# Patient Record
Sex: Male | Born: 1955 | Race: White | Hispanic: No | Marital: Married | Smoking: Former smoker
Health system: Southern US, Community
[De-identification: ages and names within clinical notes are randomized; demographics above are authoritative.]

## PROBLEM LIST (undated history)

## (undated) DIAGNOSIS — Z87442 Personal history of urinary calculi: Secondary | ICD-10-CM

## (undated) DIAGNOSIS — E7800 Pure hypercholesterolemia, unspecified: Secondary | ICD-10-CM

## (undated) DIAGNOSIS — I219 Acute myocardial infarction, unspecified: Secondary | ICD-10-CM

## (undated) DIAGNOSIS — I10 Essential (primary) hypertension: Secondary | ICD-10-CM

## (undated) DIAGNOSIS — I2510 Atherosclerotic heart disease of native coronary artery without angina pectoris: Secondary | ICD-10-CM

## (undated) HISTORY — DX: Essential (primary) hypertension: I10

## (undated) HISTORY — DX: Acute myocardial infarction, unspecified: I21.9

## (undated) HISTORY — DX: Pure hypercholesterolemia, unspecified: E78.00

## (undated) HISTORY — PX: APPENDECTOMY: SHX54

## (undated) HISTORY — PX: INGUINAL HERNIA REPAIR: SUR1180

## (undated) HISTORY — PX: OTHER SURGICAL HISTORY: SHX169

## (undated) HISTORY — DX: Atherosclerotic heart disease of native coronary artery without angina pectoris: I25.10

## (undated) HISTORY — PX: ANTERIOR CRUCIATE LIGAMENT REPAIR: SHX115

---

## 1999-07-24 HISTORY — PX: ANTERIOR CRUCIATE LIGAMENT REPAIR: SHX115

## 2005-07-23 HISTORY — PX: OTHER SURGICAL HISTORY: SHX169

## 2006-05-23 DIAGNOSIS — I219 Acute myocardial infarction, unspecified: Secondary | ICD-10-CM

## 2006-05-23 HISTORY — DX: Acute myocardial infarction, unspecified: I21.9

## 2012-02-01 ENCOUNTER — Encounter: Payer: Self-pay | Admitting: Cardiovascular Disease

## 2012-03-31 ENCOUNTER — Institutional Professional Consult (permissible substitution): Payer: Self-pay | Admitting: Cardiovascular Disease

## 2012-04-03 ENCOUNTER — Encounter: Payer: Self-pay | Admitting: *Deleted

## 2012-04-08 ENCOUNTER — Institutional Professional Consult (permissible substitution): Payer: Self-pay | Admitting: Cardiovascular Disease

## 2012-04-11 ENCOUNTER — Encounter: Payer: Self-pay | Admitting: *Deleted

## 2012-04-14 ENCOUNTER — Encounter: Payer: Self-pay | Admitting: Cardiovascular Disease

## 2012-04-14 ENCOUNTER — Ambulatory Visit (INDEPENDENT_AMBULATORY_CARE_PROVIDER_SITE_OTHER): Payer: PRIVATE HEALTH INSURANCE | Admitting: Cardiovascular Disease

## 2012-04-14 VITALS — BP 119/77 | HR 64 | Ht 71.5 in | Wt 248.8 lb

## 2012-04-14 DIAGNOSIS — I2510 Atherosclerotic heart disease of native coronary artery without angina pectoris: Secondary | ICD-10-CM

## 2012-04-14 NOTE — Progress Notes (Signed)
HPI:  56 year-old male presenting for cardiac evaluation. He was initially diagnosed with CAD in 2007 when he was presented with an anterior MI. At that time his symptoms consisted of an ache in the center of his chest. The patient had been a smoker, but he quit smoking completely at the time of his heart attack. He reports rare episodes of a "twinge" in his chest. The patient is physically active without exertional chest tightness or dyspnea. He walks for exercise. He denies palpitations, lightheadedness, orthopnea, or PND. He's been compliant with his medical program ever since his myocardial infarction several years ago. He reports mild myalgias related to atorvastatin use. He otherwise feels well.  Outpatient Encounter Prescriptions as of 04/14/2012  Medication Sig Dispense Refill  . Ascorbic Acid (VITAMIN C) 100 MG tablet Take 100 mg by mouth daily.      Marland Kitchen aspirin 81 MG tablet Take 81 mg by mouth daily.      Marland Kitchen atorvastatin (LIPITOR) 20 MG tablet Take 20 mg by mouth daily. 1/2 tablet daily      . B Complex-C (B-COMPLEX WITH VITAMIN C) tablet Take 1 tablet by mouth daily.      . carvedilol (COREG) 6.25 MG tablet Take 6.25 mg by mouth 2 (two) times daily with a meal.      . fish oil-omega-3 fatty acids 1000 MG capsule Take 1 g by mouth daily.      Marland Kitchen lisinopril (PRINIVIL,ZESTRIL) 5 MG tablet Take 5 mg by mouth daily.      Marland Kitchen OVER THE COUNTER MEDICATION Take 2 tablets by mouth daily. OsteoMatrix- Shaklee      . OVER THE COUNTER MEDICATION Take 1 tablet by mouth daily. Vita-Lea-Shaklee      . Zinc 15 MG CAPS Take 15 mg by mouth daily.      . calcium carbonate (OS-CAL) 600 MG TABS Take 600 mg by mouth daily.      Marland Kitchen DISCONTD: magnesium gluconate (MAGONATE) 500 MG tablet Take 500 mg by mouth daily.      Marland Kitchen DISCONTD: Multiple Vitamin (MULTIVITAMIN) capsule Take 1 capsule by mouth daily.        Lobster  Past Medical History  Diagnosis Date  . Heart attack 05/2006    s/p cordis stent LAD   .  Hypercholesterolemia   . HTN (hypertension)   . Coronary atherosclerosis     of native coronary artery    Past Surgical History  Procedure Date  . Anterior cruciate ligament repair     2001    History   Social History  . Marital Status: Married    Spouse Name: N/A    Number of Children: N/A  . Years of Education: N/A   Occupational History  . Not on file.   Social History Main Topics  . Smoking status: Former Smoker -- 1.5 packs/day for 30 years    Quit date: 02/08/2006  . Smokeless tobacco: Not on file  . Alcohol Use: No  . Drug Use: Not on file  . Sexually Active: Not on file   Other Topics Concern  . Not on file   Social History Narrative  . No narrative on file    Family History  Problem Relation Age of Onset  . COPD Father   . Heart failure Mother   . Heart failure Brother     ROS: General: no fevers/chills/night sweats Eyes: no blurry vision, diplopia, or amaurosis ENT: no sore throat or hearing loss Resp: no cough, wheezing,  or hemoptysis CV: no edema or palpitations GI: no abdominal pain, nausea, vomiting, diarrhea, or constipation GU: no dysuria, frequency, or hematuria. Positive for nocturia. Skin: no rash Neuro: no headache, numbness, tingling, or weakness of extremities Musculoskeletal: no joint pain or swelling Heme: no bleeding, DVT, or easy bruising Endo: no polydipsia or polyuria  BP 119/77  Pulse 64  Ht 5' 11.5" (1.816 m)  Wt 112.855 kg (248 lb 12.8 oz)  BMI 34.22 kg/m2  PHYSICAL EXAM: Pt is alert and oriented, WD, WN, pleasant overweight male in no distress. HEENT: normal Neck: JVP normal. Carotid upstrokes normal without bruits. No thyromegaly. Lungs: equal expansion, clear bilaterally CV: Apex is discrete and nondisplaced, RRR without murmur or gallop Abd: soft, NT, +BS, no bruit, no hepatosplenomegaly Back: no CVA tenderness Ext: no C/C/E        Femoral pulses 2+= without bruits        DP/PT pulses intact and = Skin: warm  and dry without rash Neuro: CNII-XII intact             Strength intact = bilaterally  EKG:  Sinus rhythm 59 bpm, within normal limits  ASSESSMENT AND PLAN: 1. Coronary artery disease, native vessel. The patient appears stable from a cardiovascular perspective. He is on appropriate medical program that includes aspirin, atorvastatin, carvedilol, and lisinopril. I have reviewed outside records from his previous cardiologist and it appears the patient's left ventricular function has been preserved with an ejection fraction of 55-60%. Followup stress testing showed no significant areas of ischemia. I would like to see him back in one year for followup.  2. Hyperlipidemia. Lipids followed by Dr. Alverda Skeans. Patient is on a statin drug. Statin dose is limited by myalgias. Goal LDL cholesterol is less than 70 mg/dL.  3. Hypertension. Excellent control blood pressure on a combination of carvedilol and lisinopril.  For followup, I would like to see the patient back in 12 months. I appreciate the opportunity to see this nice gentleman.  Tonny Bollman 05/13/2012 6:01 PM

## 2012-04-14 NOTE — Patient Instructions (Addendum)
Your physician wants you to follow-up in: 1 YEAR.  You will receive a reminder letter in the mail two months in advance. If you don't receive a letter, please call our office to schedule the follow-up appointment.  Your physician recommends that you continue on your current medications as directed. Please refer to the Current Medication list given to you today.  

## 2012-06-25 ENCOUNTER — Encounter: Payer: Self-pay | Admitting: Cardiovascular Disease

## 2012-07-14 ENCOUNTER — Encounter: Payer: Self-pay | Admitting: Cardiovascular Disease

## 2012-09-06 ENCOUNTER — Other Ambulatory Visit: Payer: Self-pay

## 2013-05-28 ENCOUNTER — Other Ambulatory Visit: Payer: Self-pay

## 2016-03-08 ENCOUNTER — Other Ambulatory Visit (HOSPITAL_COMMUNITY): Payer: Self-pay | Admitting: Nurse Practitioner

## 2016-03-08 DIAGNOSIS — B182 Chronic viral hepatitis C: Secondary | ICD-10-CM

## 2016-03-23 ENCOUNTER — Encounter: Payer: Self-pay | Admitting: Cardiovascular Disease

## 2016-04-09 ENCOUNTER — Encounter: Payer: Self-pay | Admitting: Cardiovascular Disease

## 2016-04-09 ENCOUNTER — Ambulatory Visit (INDEPENDENT_AMBULATORY_CARE_PROVIDER_SITE_OTHER): Payer: PRIVATE HEALTH INSURANCE | Admitting: Cardiovascular Disease

## 2016-04-09 VITALS — BP 112/60 | HR 62 | Ht 71.5 in | Wt 247.0 lb

## 2016-04-09 DIAGNOSIS — I25118 Atherosclerotic heart disease of native coronary artery with other forms of angina pectoris: Secondary | ICD-10-CM | POA: Diagnosis not present

## 2016-04-09 NOTE — Patient Instructions (Signed)
Medication Instructions:  Your physician recommends that you continue on your current medications as directed. Please refer to the Current Medication list given to you today.  Labwork: No new orders.   Testing/Procedures: No new orders.   Follow-Up: Your physician wants you to follow-up in: 2 YEARS with Dr Cooper.  You will receive a reminder letter in the mail two months in advance. If you don't receive a letter, please call our office to schedule the follow-up appointment.   Any Other Special Instructions Will Be Listed Below (If Applicable).     If you need a refill on your cardiac medications before your next appointment, please call your pharmacy.   

## 2016-04-09 NOTE — Progress Notes (Signed)
Cardiology Office Note Date:  04/09/2016   ID:  Fernando Mckay, DOB 06/29/1956, MRN [id number]  PCP:  Horatio Pel, MD  Cardiologist:  Sherren Mocha, MD    Chief Complaint  Patient presents with  . Coronary Artery Disease    Old MI, last seen 2013   History of Present Illness: Fernando Mckay is a 60 y.o. male who presents for evaluation of CAD.   The patient was last seen in 2013. He was initially diagnosed with coronary artery disease in 2007 when he presented with an anterior wall MI. He described symptoms of a dull ache in the center of his chest. He is a former smoker and quit at the time of his MI. His left ventricular function has been preserved with an ejection fraction of 55-60% at time of last assessment. When he was seen last he had no cardiac related complaints. Other medical problems include hypertension and hypercholesterolemia.  The patient is doing well. He has occasional discomfort in the right chest that is unlike his previous angina. It is never related to physical exertion. He thinks it is "gas pain." He denies chest pain or pressure, shortness of breath, edema, orthopnea, PND, or heart palpitations. He exercises at the gym about 3 days per week. He's had trouble losing weight. He plans to move to [place] in a few years after he retires.   Past Medical History:  Diagnosis Date  . Coronary atherosclerosis    of native coronary artery  . Heart attack ([place]) 05/2006   s/p cordis stent LAD   . HTN (hypertension)   . Hypercholesterolemia     Past Surgical History:  Procedure Laterality Date  . ANTERIOR CRUCIATE LIGAMENT REPAIR     2001    Current Outpatient Prescriptions  Medication Sig Dispense Refill  . Ascorbic Acid (VITAMIN C) 100 MG tablet Take 100 mg by mouth daily.    Marland Kitchen aspirin 81 MG tablet Take 81 mg by mouth daily.    Marland Kitchen atorvastatin (LIPITOR) 20 MG tablet Take 10 mg by mouth daily. 1/2 tablet daily    . B Complex-C (B-COMPLEX WITH  VITAMIN C) tablet Take 1 tablet by mouth daily.    . calcium carbonate (OS-CAL) 600 MG TABS Take 600 mg by mouth daily.    . carvedilol (COREG) 6.25 MG tablet Take 6.25 mg by mouth 2 (two) times daily with a meal.    . fish oil-omega-3 fatty acids 1000 MG capsule Take 1 g by mouth daily.    Marland Kitchen lisinopril (PRINIVIL,ZESTRIL) 5 MG tablet Take 5 mg by mouth daily.    . NON FORMULARY Take 1 Dose by mouth daily. CQR    . NON FORMULARY Take 1,200 mg by mouth daily. Sveratrol    . OVER THE COUNTER MEDICATION Take 2 tablets by mouth daily. OsteoMatrix- Shaklee    . OVER THE COUNTER MEDICATION Take 1 tablet by mouth daily. Vita-Lea-Shaklee    . Zinc 15 MG CAPS Take 15 mg by mouth daily.     No current facility-administered medications for this visit.     Allergies:   Lobster [shellfish allergy]; Sertraline; Shellfish-derived products; and Terbinafine hcl   Social History:  The patient  reports that he quit smoking about 10 years ago. He has a 45.00 pack-year smoking history. He has never used smokeless tobacco. He reports that he does not drink alcohol or use drugs.   Family History:  The patient's family history includes COPD in his father; Heart failure in  his brother and mother.    ROS:  Please see the history of present illness.  Otherwise, review of systems is positive for leg pains.  All other systems are reviewed and negative.    PHYSICAL EXAM: VS:  BP 112/60   Pulse 62   Ht 5' 11.5" (1.816 m)   Wt 247 lb (112 kg)   BMI 33.97 kg/m  , BMI Body mass index is 33.97 kg/m. GEN: Well nourished, well developed, in no acute distress  HEENT: normal  Neck: no JVD, no masses. No carotid bruits Cardiac: RRR without murmur or gallop                Respiratory:  clear to auscultation bilaterally, normal work of breathing GI: soft, nontender, nondistended, + BS, obese MS: no deformity or atrophy  Ext: no pretibial edema, pedal pulses 2+= bilaterally Skin: warm and dry, no rash Neuro:  Strength  and sensation are intact Psych: euthymic mood, full affect  EKG:  EKG is ordered today. The ekg ordered today shows NSR, within normal limits, 62 bpm  Recent Labs: No results found for requested labs within last 8760 hours.   Lipid Panel  No results found for: CHOL, TRIG, HDL, CHOLHDL, VLDL, LDLCALC, LDLDIRECT    Wt Readings from Last 3 Encounters:  04/09/16 247 lb (112 kg)  04/14/12 248 lb 12.8 oz (112.9 kg)     ASSESSMENT AND PLAN: 1.  Coronary artery disease, native vessel, without angina. Right-sided chest pain at rest is not consistent with his previous cardiac pain and I do not think it represents angina. He will continue with his current medical program. CV risk factors appear well controlled. Needs to work on diet and weight loss. We discussed this at length today.  2. Hyperlipidemia: Recent lipids reviewed with a cholesterol of 110, triglycerides 100, HDL 31, LDL 62 mg/dL. He is concerned that his leg pain is related to use of a statin drug. After discussion of the risk/benefit he prefers to continue atorvastatin at the current dose. He will work on diet and lifestyle modification.  3. Essential hypertension: Blood pressure is well controlled on carvedilol and lisinopril  Current medicines are reviewed with the patient today.  The patient does not have concerns regarding medicines.  Labs/ tests ordered today include:   Orders Placed This Encounter  Procedures  . EKG 12-Lead   Disposition:   FU 2 years. Consider a stress test at that time depending on his symptoms.   Deatra James, MD  04/09/2016 10:19 PM    [facility] [address], [place],   [address] Phone: [phone]; Fax: [phone]

## 2016-04-24 ENCOUNTER — Ambulatory Visit (HOSPITAL_COMMUNITY)
Admission: RE | Admit: 2016-04-24 | Discharge: 2016-04-24 | Disposition: A | Discharge status: Home / Self Care | Payer: PRIVATE HEALTH INSURANCE | Source: Ambulatory Visit | Attending: Nurse Practitioner | Admitting: Nurse Practitioner

## 2016-04-24 DIAGNOSIS — N200 Calculus of kidney: Secondary | ICD-10-CM | POA: Insufficient documentation

## 2016-04-24 DIAGNOSIS — R932 Abnormal findings on diagnostic imaging of liver and biliary tract: Secondary | ICD-10-CM | POA: Diagnosis not present

## 2016-04-24 DIAGNOSIS — B182 Chronic viral hepatitis C: Secondary | ICD-10-CM | POA: Insufficient documentation

## 2016-04-24 DIAGNOSIS — K7689 Other specified diseases of liver: Secondary | ICD-10-CM | POA: Insufficient documentation

## 2016-04-24 DIAGNOSIS — K740 Hepatic fibrosis: Secondary | ICD-10-CM | POA: Diagnosis not present

## 2016-12-05 ENCOUNTER — Other Ambulatory Visit: Payer: Self-pay | Admitting: Nurse Practitioner

## 2016-12-05 DIAGNOSIS — K7469 Other cirrhosis of liver: Secondary | ICD-10-CM

## 2016-12-21 ENCOUNTER — Other Ambulatory Visit: Payer: PRIVATE HEALTH INSURANCE

## 2016-12-25 ENCOUNTER — Ambulatory Visit
Admission: RE | Admit: 2016-12-25 | Discharge: 2016-12-25 | Disposition: A | Discharge status: Home / Self Care | Payer: PRIVATE HEALTH INSURANCE | Source: Ambulatory Visit | Attending: Nurse Practitioner | Admitting: Nurse Practitioner

## 2016-12-25 DIAGNOSIS — K7469 Other cirrhosis of liver: Secondary | ICD-10-CM

## 2017-06-07 ENCOUNTER — Other Ambulatory Visit: Payer: Self-pay | Admitting: Nurse Practitioner

## 2017-06-07 DIAGNOSIS — K7469 Other cirrhosis of liver: Secondary | ICD-10-CM

## 2017-07-04 ENCOUNTER — Encounter: Payer: Self-pay | Admitting: Cardiovascular Disease

## 2017-08-06 ENCOUNTER — Ambulatory Visit
Admission: RE | Admit: 2017-08-06 | Discharge: 2017-08-06 | Disposition: A | Discharge status: Home / Self Care | Payer: PRIVATE HEALTH INSURANCE | Source: Ambulatory Visit | Attending: Nurse Practitioner | Admitting: Nurse Practitioner

## 2017-08-06 DIAGNOSIS — K7469 Other cirrhosis of liver: Secondary | ICD-10-CM

## 2017-11-19 ENCOUNTER — Other Ambulatory Visit: Payer: Self-pay | Admitting: Nurse Practitioner

## 2017-11-19 DIAGNOSIS — K7469 Other cirrhosis of liver: Secondary | ICD-10-CM

## 2017-12-31 ENCOUNTER — Other Ambulatory Visit: Payer: BLUE CROSS/BLUE SHIELD

## 2018-01-01 ENCOUNTER — Ambulatory Visit
Admission: RE | Admit: 2018-01-01 | Discharge: 2018-01-01 | Disposition: A | Discharge status: Home / Self Care | Payer: BLUE CROSS/BLUE SHIELD | Source: Ambulatory Visit | Attending: Nurse Practitioner | Admitting: Nurse Practitioner

## 2018-01-01 DIAGNOSIS — K7469 Other cirrhosis of liver: Secondary | ICD-10-CM

## 2018-04-22 ENCOUNTER — Other Ambulatory Visit: Payer: Self-pay | Admitting: Urology

## 2018-04-22 HISTORY — PX: OTHER SURGICAL HISTORY: SHX169

## 2018-05-26 ENCOUNTER — Encounter (HOSPITAL_BASED_OUTPATIENT_CLINIC_OR_DEPARTMENT_OTHER): Payer: Self-pay | Admitting: *Deleted

## 2018-05-26 ENCOUNTER — Other Ambulatory Visit: Payer: Self-pay

## 2018-05-26 NOTE — Progress Notes (Signed)
Spoke with Lyndall Npo after midnight arrive 900 am 06-02-18 [facility] med[facility] to take sip of water: carvedilol lov dr cooper cardiology 04-09-16 epic/chart Driver wife charlotte cell [phone] Surgery orders in epic needs I stat 4 and ekg

## 2018-06-02 ENCOUNTER — Other Ambulatory Visit: Payer: Self-pay

## 2018-06-02 ENCOUNTER — Encounter (HOSPITAL_BASED_OUTPATIENT_CLINIC_OR_DEPARTMENT_OTHER): Payer: Self-pay

## 2018-06-02 ENCOUNTER — Ambulatory Visit (HOSPITAL_BASED_OUTPATIENT_CLINIC_OR_DEPARTMENT_OTHER)
Admission: RE | Admit: 2018-06-02 | Discharge: 2018-06-02 | Disposition: A | Discharge status: Home / Self Care | Payer: BLUE CROSS/BLUE SHIELD | Source: Ambulatory Visit | Attending: Urology | Admitting: Urology

## 2018-06-02 ENCOUNTER — Encounter (HOSPITAL_BASED_OUTPATIENT_CLINIC_OR_DEPARTMENT_OTHER)
Admission: RE | Disposition: A | Discharge status: Home / Self Care | Payer: Self-pay | Source: Ambulatory Visit | Attending: Urology

## 2018-06-02 ENCOUNTER — Ambulatory Visit (HOSPITAL_BASED_OUTPATIENT_CLINIC_OR_DEPARTMENT_OTHER): Payer: BLUE CROSS/BLUE SHIELD | Admitting: Anesthesiology

## 2018-06-02 DIAGNOSIS — Z6833 Body mass index (BMI) 33.0-33.9, adult: Secondary | ICD-10-CM | POA: Diagnosis not present

## 2018-06-02 DIAGNOSIS — C672 Malignant neoplasm of lateral wall of bladder: Secondary | ICD-10-CM | POA: Insufficient documentation

## 2018-06-02 DIAGNOSIS — I10 Essential (primary) hypertension: Secondary | ICD-10-CM | POA: Diagnosis not present

## 2018-06-02 DIAGNOSIS — Z87891 Personal history of nicotine dependence: Secondary | ICD-10-CM | POA: Diagnosis not present

## 2018-06-02 DIAGNOSIS — Z955 Presence of coronary angioplasty implant and graft: Secondary | ICD-10-CM | POA: Diagnosis not present

## 2018-06-02 DIAGNOSIS — I252 Old myocardial infarction: Secondary | ICD-10-CM | POA: Insufficient documentation

## 2018-06-02 DIAGNOSIS — I2510 Atherosclerotic heart disease of native coronary artery without angina pectoris: Secondary | ICD-10-CM | POA: Diagnosis not present

## 2018-06-02 DIAGNOSIS — N201 Calculus of ureter: Secondary | ICD-10-CM | POA: Insufficient documentation

## 2018-06-02 DIAGNOSIS — R109 Unspecified abdominal pain: Secondary | ICD-10-CM | POA: Diagnosis present

## 2018-06-02 DIAGNOSIS — E669 Obesity, unspecified: Secondary | ICD-10-CM | POA: Diagnosis not present

## 2018-06-02 HISTORY — PX: CYSTOSCOPY WITH BIOPSY: SHX5122

## 2018-06-02 HISTORY — PX: HOLMIUM LASER APPLICATION: SHX5852

## 2018-06-02 HISTORY — DX: Personal history of urinary calculi: Z87.442

## 2018-06-02 HISTORY — PX: CYSTOSCOPY WITH RETROGRADE PYELOGRAM, URETEROSCOPY AND STENT PLACEMENT: SHX5789

## 2018-06-02 LAB — POCT I-STAT 4, (NA,K, GLUC, HGB,HCT)
Glucose, Bld: 102 mg/dL — ABNORMAL HIGH (ref 70–99)
HEMATOCRIT: 40 % (ref 39.0–52.0)
Hemoglobin: 13.6 g/dL (ref 13.0–17.0)
Potassium: 4.2 mmol/L (ref 3.5–5.1)
SODIUM: 142 mmol/L (ref 135–145)

## 2018-06-02 SURGERY — CYSTOURETEROSCOPY, WITH RETROGRADE PYELOGRAM AND STENT INSERTION
Anesthesia: General | Site: Ureter

## 2018-06-02 MED ORDER — EPHEDRINE 5 MG/ML INJ
INTRAVENOUS | Status: AC
  Filled 2018-06-02: qty 10

## 2018-06-02 MED ORDER — GLYCOPYRROLATE 0.2 MG/ML IJ SOLN
INTRAMUSCULAR | Status: DC | PRN
  Administered 2018-06-02: 0.2 mg via INTRAVENOUS

## 2018-06-02 MED ORDER — OXYCODONE-ACETAMINOPHEN 5-325 MG PO TABS: 1.0000 | ORAL_TABLET | ORAL | 0 refills | Status: AC | PRN

## 2018-06-02 MED ORDER — PHENYLEPHRINE 40 MCG/ML (10ML) SYRINGE FOR IV PUSH (FOR BLOOD PRESSURE SUPPORT)
PREFILLED_SYRINGE | INTRAVENOUS | Status: AC
  Filled 2018-06-02: qty 10

## 2018-06-02 MED ORDER — FENTANYL CITRATE (PF) 100 MCG/2ML IJ SOLN
INTRAMUSCULAR | Status: AC
  Filled 2018-06-02: qty 2

## 2018-06-02 MED ORDER — OXYCODONE HCL 5 MG/5ML PO SOLN
5.0000 mg | Freq: Once | ORAL | Status: DC | PRN
  Filled 2018-06-02: qty 5

## 2018-06-02 MED ORDER — ONDANSETRON HCL 4 MG/2ML IJ SOLN
INTRAMUSCULAR | Status: AC
  Filled 2018-06-02: qty 2

## 2018-06-02 MED ORDER — PHENYLEPHRINE 40 MCG/ML (10ML) SYRINGE FOR IV PUSH (FOR BLOOD PRESSURE SUPPORT)
PREFILLED_SYRINGE | INTRAVENOUS | Status: DC | PRN
  Administered 2018-06-02: 80 ug via INTRAVENOUS
  Administered 2018-06-02: 120 ug via INTRAVENOUS
  Administered 2018-06-02: 80 ug via INTRAVENOUS

## 2018-06-02 MED ORDER — MIDAZOLAM HCL 5 MG/5ML IJ SOLN
INTRAMUSCULAR | Status: DC | PRN
  Administered 2018-06-02: 2 mg via INTRAVENOUS

## 2018-06-02 MED ORDER — LIDOCAINE 2% (20 MG/ML) 5 ML SYRINGE
INTRAMUSCULAR | Status: DC | PRN
  Administered 2018-06-02: 100 mg via INTRAVENOUS

## 2018-06-02 MED ORDER — KETOROLAC TROMETHAMINE 30 MG/ML IJ SOLN
INTRAMUSCULAR | Status: DC | PRN
  Administered 2018-06-02: 30 mg via INTRAVENOUS

## 2018-06-02 MED ORDER — FENTANYL CITRATE (PF) 100 MCG/2ML IJ SOLN
25.0000 ug | INTRAMUSCULAR | Status: DC | PRN
  Filled 2018-06-02: qty 1

## 2018-06-02 MED ORDER — FENTANYL CITRATE (PF) 100 MCG/2ML IJ SOLN
INTRAMUSCULAR | Status: DC | PRN
  Administered 2018-06-02 (×2): 50 ug via INTRAVENOUS

## 2018-06-02 MED ORDER — LACTATED RINGERS IV SOLN
INTRAVENOUS | Status: DC
  Administered 2018-06-02 (×2): via INTRAVENOUS
  Filled 2018-06-02: qty 1000

## 2018-06-02 MED ORDER — ONDANSETRON HCL 4 MG/2ML IJ SOLN
INTRAMUSCULAR | Status: DC | PRN
  Administered 2018-06-02: 4 mg via INTRAVENOUS

## 2018-06-02 MED ORDER — IOHEXOL 300 MG/ML  SOLN
INTRAMUSCULAR | Status: DC | PRN
  Administered 2018-06-02: 10 mL

## 2018-06-02 MED ORDER — OXYCODONE HCL 5 MG PO TABS
5.0000 mg | ORAL_TABLET | Freq: Once | ORAL | Status: DC | PRN
  Filled 2018-06-02: qty 1

## 2018-06-02 MED ORDER — PROPOFOL 10 MG/ML IV BOLUS
INTRAVENOUS | Status: DC | PRN
  Administered 2018-06-02: 180 mg via INTRAVENOUS

## 2018-06-02 MED ORDER — MIDAZOLAM HCL 2 MG/2ML IJ SOLN
INTRAMUSCULAR | Status: AC
  Filled 2018-06-02: qty 2

## 2018-06-02 MED ORDER — CEFAZOLIN SODIUM-DEXTROSE 2-4 GM/100ML-% IV SOLN
INTRAVENOUS | Status: AC
  Filled 2018-06-02: qty 100

## 2018-06-02 MED ORDER — ONDANSETRON HCL 4 MG/2ML IJ SOLN
4.0000 mg | Freq: Once | INTRAMUSCULAR | Status: DC | PRN
  Filled 2018-06-02: qty 2

## 2018-06-02 MED ORDER — DEXAMETHASONE SODIUM PHOSPHATE 10 MG/ML IJ SOLN
INTRAMUSCULAR | Status: AC
  Filled 2018-06-02: qty 1

## 2018-06-02 MED ORDER — GLYCOPYRROLATE PF 0.2 MG/ML IJ SOSY
PREFILLED_SYRINGE | INTRAMUSCULAR | Status: AC
  Filled 2018-06-02: qty 1

## 2018-06-02 MED ORDER — CEFAZOLIN SODIUM-DEXTROSE 2-4 GM/100ML-% IV SOLN
2.0000 g | INTRAVENOUS | Status: AC
  Administered 2018-06-02: 2 g via INTRAVENOUS
  Filled 2018-06-02: qty 100

## 2018-06-02 MED ORDER — DEXAMETHASONE SODIUM PHOSPHATE 10 MG/ML IJ SOLN
INTRAMUSCULAR | Status: DC | PRN
  Administered 2018-06-02: 10 mg via INTRAVENOUS

## 2018-06-02 MED ORDER — LIDOCAINE 2% (20 MG/ML) 5 ML SYRINGE
INTRAMUSCULAR | Status: AC
  Filled 2018-06-02: qty 5

## 2018-06-02 MED ORDER — PROPOFOL 10 MG/ML IV BOLUS
INTRAVENOUS | Status: AC
  Filled 2018-06-02: qty 40

## 2018-06-02 MED ORDER — KETOROLAC TROMETHAMINE 30 MG/ML IJ SOLN
INTRAMUSCULAR | Status: AC
  Filled 2018-06-02: qty 1

## 2018-06-02 MED ORDER — EPHEDRINE SULFATE-NACL 50-0.9 MG/10ML-% IV SOSY
PREFILLED_SYRINGE | INTRAVENOUS | Status: DC | PRN
  Administered 2018-06-02 (×3): 10 mg via INTRAVENOUS

## 2018-06-02 MED ORDER — SODIUM CHLORIDE 0.9 % IR SOLN
Status: DC | PRN
  Administered 2018-06-02: 3000 mL

## 2018-06-02 SURGICAL SUPPLY — 29 items
BAG DRAIN URO-CYSTO SKYTR STRL (DRAIN) ×3 IMPLANT
BASKET STONE 1.7 NGAGE (UROLOGICAL SUPPLIES) IMPLANT
CATH FOLEY 2WAY SLVR  5CC 18FR (CATHETERS) ×1
CATH FOLEY 2WAY SLVR 5CC 18FR (CATHETERS) ×2 IMPLANT
CATH INTERMIT  6FR 70CM (CATHETERS) ×3 IMPLANT
CLOTH BEACON ORANGE TIMEOUT ST (SAFETY) ×3 IMPLANT
EXTRACTOR STONE 1.7FRX115CM (UROLOGICAL SUPPLIES) IMPLANT
FIBER LASER FLEXIVA 365 (UROLOGICAL SUPPLIES) IMPLANT
FIBER LASER TRAC TIP (UROLOGICAL SUPPLIES) ×3 IMPLANT
GLOVE BIO SURGEON STRL SZ7 (GLOVE) ×3 IMPLANT
GLOVE BIO SURGEON STRL SZ8 (GLOVE) ×3 IMPLANT
GLOVE BIOGEL PI IND STRL 7.0 (GLOVE) ×4 IMPLANT
GLOVE BIOGEL PI INDICATOR 7.0 (GLOVE) ×2
GOWN STRL REUS W/TWL LRG LVL3 (GOWN DISPOSABLE) ×3 IMPLANT
GOWN STRL REUS W/TWL XL LVL3 (GOWN DISPOSABLE) ×3 IMPLANT
GUIDEWIRE ANG ZIPWIRE 038X150 (WIRE) ×6 IMPLANT
GUIDEWIRE STR DUAL SENSOR (WIRE) IMPLANT
IV NS 1000ML (IV SOLUTION)
IV NS 1000ML BAXH (IV SOLUTION) IMPLANT
IV NS IRRIG 3000ML ARTHROMATIC (IV SOLUTION) ×3 IMPLANT
KIT TURNOVER CYSTO (KITS) ×3 IMPLANT
MANIFOLD NEPTUNE II (INSTRUMENTS) ×3 IMPLANT
NS IRRIG 500ML POUR BTL (IV SOLUTION) IMPLANT
PACK CYSTO (CUSTOM PROCEDURE TRAY) ×3 IMPLANT
SHEATH URET ACCESS 12FR/35CM (UROLOGICAL SUPPLIES) ×3 IMPLANT
STENT URET 6FRX26 CONTOUR (STENTS) ×3 IMPLANT
SYR 10ML LL (SYRINGE) ×3 IMPLANT
TUBE CONNECTING 12X1/4 (SUCTIONS) ×3 IMPLANT
TUBING UROLOGY SET (TUBING) ×3 IMPLANT

## 2018-06-02 NOTE — H&P (Signed)
Urology Admission H&P  Chief Complaint: left flank pain  History of Present Illness:  Fernando Mckay is a [phone] with a hx of gross hematuria who was found to have a 1.4cm left UPJ calculus. He has mild left flank pain. No LUTS. No recent hematuria  Past Medical History:  Diagnosis Date  . Coronary atherosclerosis     of native coronary artery  . Coronary atherosclerosis    of native coronary artery  . Heart attack ([place]) 05/2006   s/p cordis stent LAD x 1  . History of kidney stones   . HTN (hypertension)   . Hypercholesterolemia   . MI (myocardial infarction) ([place]) 05/2006   with s/p cordis stent LAD [facility]   Past Surgical History:  Procedure Laterality Date  . ANTERIOR CRUCIATE LIGAMENT REPAIR Right 2001  . ANTERIOR CRUCIATE LIGAMENT REPAIR     2001  . APPENDECTOMY    . colonscopy and endoscopy  04/2018   polyps removed from intestine  . INGUINAL HERNIA REPAIR     infant age 85 age 11 done x 3 2on right 1 on left age 30  . right arm skin graft  age 56  . stents  2007   LAD    Home Medications:  Current Facility-Administered Medications  Medication Dose Route Frequency Provider Last Rate Last Dose  . ceFAZolin (ANCEF) IVPB 2g/100 mL premix  2 g Intravenous 30 min Pre-Op McKenzie, Candee Furbish, MD      . iohexol (OMNIPAQUE) 300 MG/ML solution    PRN Cleon Gustin, MD   10 mL at 06/02/18 1054  . lactated ringers infusion   Intravenous Continuous Audry Pili, MD 50 mL/hr at 06/02/18 [phone]    . sodium chloride irrigation 0.9 %    PRN Cleon Gustin, MD   3,000 mL at 06/02/18 1055   Allergies:  Allergies  Allergen Reactions  . Lobster [Shellfish Allergy] Anaphylaxis    And crustacians  . Lobster [Shellfish Allergy]   . Sertraline Rash  . Shellfish-Derived Products Rash  . Terbinafine Rash    Family History  Problem Relation Age of Onset  . COPD Father   . Heart failure Mother   . Heart failure Brother 74   Social History:  reports that he quit  smoking about 12 years ago. He has a 45.00 pack-year smoking history. He has never used smokeless tobacco. He reports that he does not drink alcohol or use drugs.  Review of Systems  Genitourinary: Positive for flank pain.  All other systems reviewed and are negative.   Physical Exam:  Vital signs in last 24 hours: Temp:  [98.5 F (36.9 C)] 98.5 F (36.9 C) (11/11 0924) Resp:  [12] 12 (11/11 0924) BP: (109)/(74) 109/74 (11/11 0924) SpO2:  [98 %] 98 % (11/11 0924) Weight:  [112.1 kg] 112.1 kg (11/11 0924) Physical Exam  Constitutional: He is oriented to person, place, and time. He appears well-developed and well-nourished.  HENT:  Head: Normocephalic and atraumatic.  Eyes: Pupils are equal, round, and reactive to light. EOM are normal.  Neck: Normal range of motion. No thyromegaly present.  Cardiovascular: Normal rate and regular rhythm.  Respiratory: Effort normal. No respiratory distress.  GI: Soft. He exhibits no distension.  Musculoskeletal: Normal range of motion. He exhibits no edema.  Neurological: He is alert and oriented to person, place, and time.  Skin: Skin is warm and dry.  Psychiatric: He has a normal mood and affect. His behavior is normal. Judgment  and thought content normal.    Laboratory Data:  Results for orders placed or performed during the hospital encounter of 06/02/18 (from the past 24 hour(s))  I-STAT 4, (NA,K, GLUC, HGB,HCT)     Status: Abnormal   Collection Time: 06/02/18  9:43 AM  Result Value Ref Range   Sodium 142 135 - 145 mmol/L   Potassium 4.2 3.5 - 5.1 mmol/L   Glucose, Bld 102 (H) 70 - 99 mg/dL   HCT 40.0 39.0 - 52.0 %   Hemoglobin 13.6 13.0 - 17.0 g/dL   No results found for this or any previous visit (from the past 240 hour(s)). Creatinine: No results for input(s): CREATININE in the last 168 hours. Baseline Creatinine: unknown  Impression/Assessment:  62yo with left renal calculus  Plan:  The risks/benefits/alternatives to L  ureteroscopic stone extraction was explained to the patient and he understands and wishes to proceed with surgery  Nicolette Bang 06/02/2018, 11:01 AM

## 2018-06-02 NOTE — Anesthesia Postprocedure Evaluation (Signed)
Anesthesia Post Note  Patient: Fernando Mckay  Procedure(s) Performed: CYSTOSCOPY WITH RETROGRADE PYELOGRAM, URETEROSCOPY AND STENT PLACEMENT (Left Ureter) HOLMIUM LASER APPLICATION (Left Ureter) CYSTOSCOPY WITH BIOPSY BLADDER TUMOR (N/A Bladder)     Patient location during evaluation: PACU Anesthesia Type: General Level of consciousness: awake and alert Pain management: pain level controlled Vital Signs Assessment: post-procedure vital signs reviewed and stable Respiratory status: spontaneous breathing, nonlabored ventilation and respiratory function stable Cardiovascular status: blood pressure returned to baseline and stable Postop Assessment: no apparent nausea or vomiting Anesthetic complications: no    Last Vitals:  Vitals:   06/02/18 1300 06/02/18 1345  BP: 117/73 131/63  Pulse: 69 68  Resp: 15 16  Temp:  36.7 C  SpO2: 92% 96%    Last Pain:  Vitals:   06/02/18 1345  TempSrc:   PainSc: 0-No pain                 Audry Pili

## 2018-06-02 NOTE — Discharge Instructions (Signed)
[facility] [phone] Post Ureteroscopy With or Without Stent Instructions  Definitions:  Ureter: The duct that transports urine from the kidney to the bladder. Stent:   A plastic hollow tube that is placed into the ureter, from the kidney to the                 bladder to prevent the ureter from swelling shut.  GENERAL INSTRUCTIONS:  Despite the fact that no skin incisions were used, the area around the ureter and bladder is raw and irritated. The stent is a foreign body which will further irritate the bladder wall. This irritation is manifested by increased frequency of urination, both day and night, and by an increase in the urge to urinate. In some, the urge to urinate is present almost always. Sometimes the urge is strong enough that you may not be able to stop yourself from urinating. The only real cure is to remove the stent and then give time for the bladder wall to heal which can't be done until the danger of the ureter swelling shut has passed, which varies.  You may see some blood in your urine while the stent is in place and a few days afterwards. Do not be alarmed, even if the urine was clear for a while. Get off your feet and drink lots of fluids until clearing occurs. If you start to pass clots or don't improve, call us.  DIET: You may return to your normal diet immediately. Because of the raw surface of your bladder, alcohol, spicy foods, acid type foods and drinks with caffeine may cause irritation or frequency and should be used in moderation. To keep your urine flowing freely and to avoid constipation, drink plenty of fluids during the day ( 8-10 glasses ). Tip: Avoid cranberry juice because it is very acidic.  ACTIVITY: Your physical activity doesn't need to be restricted. However, if you are very active, you may see some blood in your urine. We suggest that you reduce your activity under these circumstances until the bleeding has stopped.  BOWELS: It is  important to keep your bowels regular during the postoperative period. Straining with bowel movements can cause bleeding. A bowel movement every other day is reasonable. Use a mild laxative if needed, such as Milk of Magnesia 2-3 tablespoons, or 2 Dulcolax tablets. Call if you continue to have problems. If you have been taking narcotics for pain, before, during or after your surgery, you may be constipated. Take a laxative if necessary.   MEDICATION: You should resume your pre-surgery medications unless told not to. In addition you will often be given an antibiotic to prevent infection. These should be taken as prescribed until the bottles are finished unless you are having an unusual reaction to one of the drugs.  PROBLEMS YOU SHOULD REPORT TO [place]:  Fevers over 100.5 Fahrenheit.  Heavy bleeding, or clots ( See above notes about blood in urine ).  Inability to urinate.  Drug reactions ( hives, rash, nausea, vomiting, diarrhea ).  Severe burning or pain with urination that is not improving.  FOLLOW-UP: You will need a follow-up appointment to monitor your progress. Call for this appointment at the number listed above. Usually the first appointment will be about three to fourteen days after your surgery.   NO ADVIL, ALEVE, MOTRIN, IBUPROFEN UNTIL 5:30 PM TODAY     Indwelling Urinary Catheter Care, Adult Take good care of your catheter to keep it working and to prevent problems. How to  wear your catheter Attach your catheter to your leg with tape (adhesive tape) or a leg strap. Make sure it is not too tight. If you use tape, remove any bits of tape that are already on the catheter. How to wear a drainage bag You should have:  A large overnight bag.  A small leg bag.  Overnight Bag You may wear the overnight bag at any time. Always keep the bag below the level of your bladder but off the floor. When you sleep, put a clean plastic bag in a wastebasket. Then hang the bag inside the  wastebasket. Leg Bag Never wear the leg bag at night. Always wear the leg bag below your knee. Keep the leg bag secure with a leg strap or tape. How to care for your skin  Clean the skin around the catheter at least once every day.  Shower every day. Do not take baths.  Put creams, lotions, or ointments on your genital area only as told by your doctor.  Do not use powders, sprays, or lotions on your genital area. How to clean your catheter and your skin 1. Wash your hands with soap and water. 2. Wet a washcloth in warm water and gentle (mild) soap. 3. Use the washcloth to clean the skin where the catheter enters your body. Clean downward and wipe away from the catheter in small circles. Do not wipe toward the catheter. 4. Pat the area dry with a clean towel. Make sure to clean off all soap. How to care for your drainage bags Empty your drainage bag when it is ?- full or at least 2-3 times a day. Replace your drainage bag once a month or sooner if it starts to smell bad or look dirty. Do not clean your drainage bag unless told by your doctor. Emptying a drainage bag  Supplies Needed  Rubbing alcohol.  Gauze pad or cotton ball.  Tape or a leg strap.  Steps 1. Wash your hands with soap and water. 2. Separate (detach) the bag from your leg. 3. Hold the bag over the toilet or a clean container. Keep the bag below your hips and bladder. This stops pee (urine) from going back into the tube. 4. Open the pour spout at the bottom of the bag. 5. Empty the pee into the toilet or container. Do not let the pour spout touch any surface. 6. Put rubbing alcohol on a gauze pad or cotton ball. 7. Use the gauze pad or cotton ball to clean the pour spout. 8. Close the pour spout. 9. Attach the bag to your leg with tape or a leg strap. 10. Wash your hands.  Changing a drainage bag Supplies Needed  Alcohol wipes.  A clean drainage bag.  Adhesive tape or a leg strap.  Steps 1. Wash your  hands with soap and water. 2. Separate the dirty bag from your leg. 3. Pinch the rubber catheter with your fingers so that pee does not spill out. 4. Separate the catheter tube from the drainage tube where these tubes connect (at the connection valve). Do not let the tubes touch any surface. 5. Clean the end of the catheter tube with an alcohol wipe. Use a different alcohol wipe to clean the end of the drainage tube. 6. Connect the catheter tube to the drainage tube of the clean bag. 7. Attach the new bag to the leg with adhesive tape or a leg strap. 8. Wash your hands.  How to prevent infection and other  problems  Never pull on your catheter or try to remove it. Pulling can damage tissue in your body.  Always wash your hands before and after touching your catheter.  If a leg strap gets wet, replace it with a dry one.  Drink enough fluids to keep your pee clear or pale yellow, or as told by your doctor.  Do not let the drainage bag or tubing touch the floor.  Wear cotton underwear.  If you are male, wipe from front to back after you poop (have a bowel movement).  Check on the catheter often to make sure it works and the tubing is not twisted. Get help if:  Your pee is cloudy.  Your pee smells unusually bad.  Your pee is not draining into the bag.  Your tube gets clogged.  Your catheter starts to leak.  Your bladder feels full. Get help right away if:  You have redness, swelling, or pain where the catheter enters your body.  You have fluid, pus, or a bad smell coming from the area where the catheter enters your body.  The area where the catheter enters your body feels warm.  You have a fever.  You have pain in your: ? Stomach (abdomen). ? Legs. ? Lower back. ? Bladder.  You see blood fill the catheter.  Your pee is pink or red.  You feel sick to your stomach (nauseous).  You throw up (vomit).  You have chills.  Your catheter gets pulled out. This  information is not intended to replace advice given to you by your health care provider. Make sure you discuss any questions you have with your health care provider. Document Released: 11/03/2012 Document Revised: 06/06/2016 Document Reviewed: 12/22/2013 Elsevier Interactive Patient Education  2018 [facility] Instructions  Activity: Get plenty of rest for the remainder of the day. A responsible adult should stay with you for 24 hours following the procedure.  For the next 24 hours, DO NOT: -Drive a car -Paediatric nurse -Drink alcoholic beverages -Take any medication unless instructed by your physician -Make any legal decisions or sign important papers.  Meals: Start with liquid foods such as gelatin or soup. Progress to regular foods as tolerated. Avoid greasy, spicy, heavy foods. If nausea and/or vomiting occur, drink only clear liquids until the nausea and/or vomiting subsides. Call your physician if vomiting continues.  Special Instructions/Symptoms: Your throat may feel dry or sore from the anesthesia or the breathing tube placed in your throat during surgery. If this causes discomfort, gargle with warm salt water. The discomfort should disappear within 24 hours.  If you had a scopolamine patch placed behind your ear for the management of post- operative nausea and/or vomiting:  1. The medication in the patch is effective for 72 hours, after which it should be removed.  Wrap patch in a tissue and discard in the trash. Wash hands thoroughly with soap and water. 2. You may remove the patch earlier than 72 hours if you experience unpleasant side effects which may include dry mouth, dizziness or visual disturbances. 3. Avoid touching the patch. Wash your hands with soap and water after contact with the patch.

## 2018-06-02 NOTE — Anesthesia Preprocedure Evaluation (Addendum)
Anesthesia Evaluation  Patient identified by MRN, date of birth, ID band Patient awake    Reviewed: Allergy & Precautions, NPO status , Patient's Chart, lab work & pertinent test results, reviewed documented beta blocker date and time   History of Anesthesia Complications Negative for: history of anesthetic complications  Airway Mallampati: II  TM Distance: >3 FB Neck ROM: Full    Dental  (+) Dental Advisory Given, Teeth Intact   Pulmonary former smoker,    breath sounds clear to auscultation       Cardiovascular hypertension, Pt. on home beta blockers and Pt. on medications + CAD, + Past MI and + Cardiac Stents   Rhythm:Regular Rate:Normal   '12 TTE - EF 55-60%, mild LVH, mild diastolic dysfunction, mild left atrial enlargement, mild MR and TR.    Neuro/Psych negative neurological ROS  negative psych ROS   GI/Hepatic negative GI ROS, Neg liver ROS,   Endo/Other   Obesity   Renal/GU  Kidney stones      Musculoskeletal negative musculoskeletal ROS (+)   Abdominal   Peds  Hematology negative hematology ROS (+)   Anesthesia Other Findings   Reproductive/Obstetrics                            Anesthesia Physical Anesthesia Plan  ASA: III  Anesthesia Plan: General   Post-op Pain Management:    Induction: Intravenous  PONV Risk Score and Plan: 2 and Treatment may vary due to age or medical condition, Ondansetron and Dexamethasone  Airway Management Planned: LMA  Additional Equipment: None  Intra-op Plan:   Post-operative Plan: Extubation in OR  Informed Consent: I have reviewed the patients History and Physical, chart, labs and discussed the procedure including the risks, benefits and alternatives for the proposed anesthesia with the patient or authorized representative who has indicated his/her understanding and acceptance.   Dental advisory given  Plan Discussed with: CRNA  and Anesthesiologist  Anesthesia Plan Comments:        Anesthesia Quick Evaluation

## 2018-06-02 NOTE — Transfer of Care (Signed)
Immediate Anesthesia Transfer of Care Note  Patient: Fernando Mckay  Procedure(s) Performed: CYSTOSCOPY WITH RETROGRADE PYELOGRAM, URETEROSCOPY AND STENT PLACEMENT (Left Ureter) HOLMIUM LASER APPLICATION (Left Ureter) CYSTOSCOPY WITH BIOPSY (N/A Bladder)  Patient Location: PACU  Anesthesia Type:General  Level of Consciousness: awake, alert  and oriented  Airway & Oxygen Therapy: Patient Spontanous Breathing and Patient connected to nasal cannula oxygen  Post-op Assessment: Report given to RN  Post vital signs: Reviewed and stable  Last Vitals:  Vitals Value Taken Time  BP 139/72 06/02/2018 12:48 PM  Temp 36.8 C 06/02/2018 12:48 PM  Pulse 72 06/02/2018 12:49 PM  Resp 13 06/02/2018 12:49 PM  SpO2 94 % 06/02/2018 12:49 PM  Vitals shown include unvalidated device data.  Last Pain:  Vitals:   06/02/18 0924  TempSrc: Oral  PainSc: 0-No pain      Patients Stated Pain Goal: 5 (82/06/01 5615)  Complications: No apparent anesthesia complications

## 2018-06-02 NOTE — Op Note (Signed)
.  Preoperative diagnosis: Left ureteral stone  Postoperative diagnosis: Same  Procedure: 1 cystoscopy 2. Left retrograde pyelography 3.  Intraoperative fluoroscopy, under one hour, with interpretation 4.  Left ureteroscopic stone manipulation with laser lithotripsy 5.  Left 6 x 26 JJ stent placement 6. Bladder biopsy with fulgeration  Attending: Rosie Fate  Anesthesia: General  Estimated blood loss: None  Drains: Left 6 x 26 JJ ureteral stent without tether 18 French foley  Specimens: stone for analysis  Antibiotics: ancef  Findings: left lower pole stone. No hydronephrosis. 0.5cm sessile lesion on left lateral wall. Ureteral orifices in normal anatomic location.  Indications: Patient is a 62 year old male/male with a history of left renal stone and who has persistent left flank pain.  After discussing treatment options, he decided proceed with left ureteroscopic stone manipulation.  Procedure her in detail: The patient was brought to the operating room and a brief timeout was done to ensure correct patient, correct procedure, correct site.  General anesthesia was administered patient was placed in dorsal lithotomy position.  His genitalia was then prepped and draped in usual sterile fashion.  A rigid 39 French cystoscope was passed in the urethra and the bladder.  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 Using a biopsy grasper the tumor was removed and hemostasis was obtained with electrocautery.  the ureteral orifices were in the normal orthotopic locations.  a 6 french ureteral catheter was then instilled into the left ureteral orifice.  a gentle retrograde was obtained and findings noted above.  we then placed a zip wire through the ureteral catheter and advanced up to the renal pelvis.  we then removed the cystoscope and cannulated the left ureteral orifice with a semirigid ureteroscope.  No stone was found in the ureter. Once we reached the UPJ a  sensor wire was advanced in to the renal pelvis. We then removed the ureteroscope and advanced am 12/14 x 35cm access sheath up to the renal pelvis. We then used the flexible ureteroscope to perform nephroscopy. We encountered the stone in the lower pole.  Using a 200nm laser fiber the stone was dusted.  The larger pieces were then removed with a Ngage basket.    once all stone fragments were removed we then removed the access sheath under direct vision and noted no injury to the ureter. We then placed a 6 x 26 double-j ureteral stent over the original zip wire.  We then removed the wire and good coil was noted in the the renal pelvis under fluoroscopy and the bladder under direct vision. the bladder was then drained and this concluded the procedure which was well tolerated by patient.  Complications: None  Condition: Stable, extubated, transferred to PACU  Plan: Patient is to be discharged home as to follow-up in one week for stent removal. He is to have a voiding trial in 1 week

## 2018-06-02 NOTE — Anesthesia Procedure Notes (Signed)
Procedure Name: LMA Insertion Date/Time: 06/02/2018 11:14 AM Performed by: Bonney Aid, CRNA Pre-anesthesia Checklist: Patient identified, Emergency Drugs available, Suction available and Patient being monitored Patient Re-evaluated:Patient Re-evaluated prior to induction Oxygen Delivery Method: Circle system utilized Preoxygenation: Pre-oxygenation with 100% oxygen Induction Type: IV induction Ventilation: Mask ventilation without difficulty LMA: LMA inserted LMA Size: 5.0 Number of attempts: 1 Airway Equipment and Method: Bite block Placement Confirmation: positive ETCO2 Tube secured with: Tape Dental Injury: Teeth and Oropharynx as per pre-operative assessment

## 2018-06-03 ENCOUNTER — Encounter (HOSPITAL_BASED_OUTPATIENT_CLINIC_OR_DEPARTMENT_OTHER): Payer: Self-pay | Admitting: Urology

## 2018-08-11 IMAGING — US US ABDOMEN LIMITED
1 series · 14 of 25 positions shown · non-contrast
Comparison: 04/24/2016.

CLINICAL DATA: 61-year-old male with cirrhosis. Checkup. No pain or
other symptoms.

EXAM:
ULTRASOUND ABDOMEN LIMITED RIGHT UPPER QUADRANT

[Series 1: us abdomen limited · 0.17mm/px · 14 of 46 slices shown]
[im 1/46]
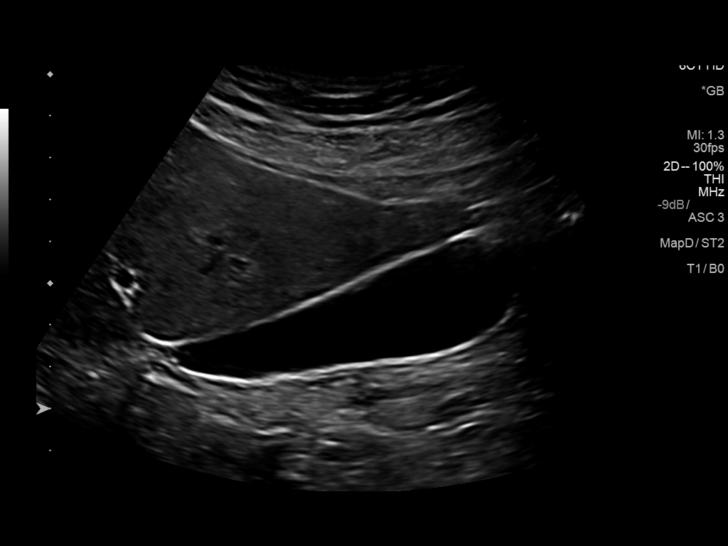
[im 4/46]
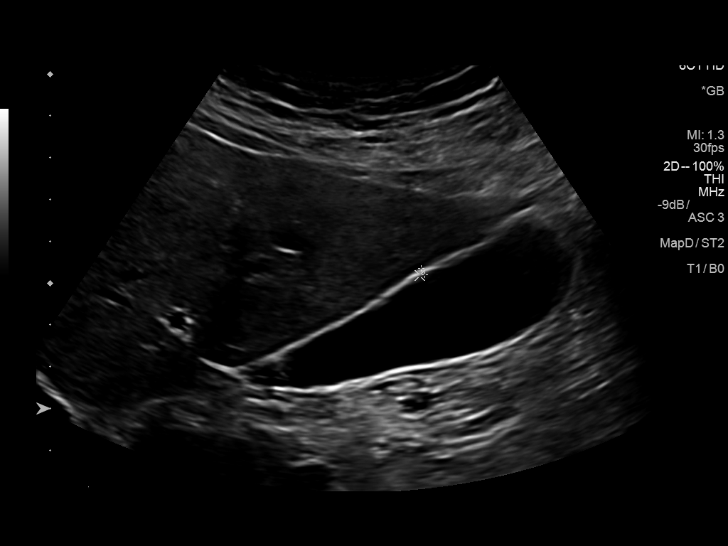
[im 8/46]
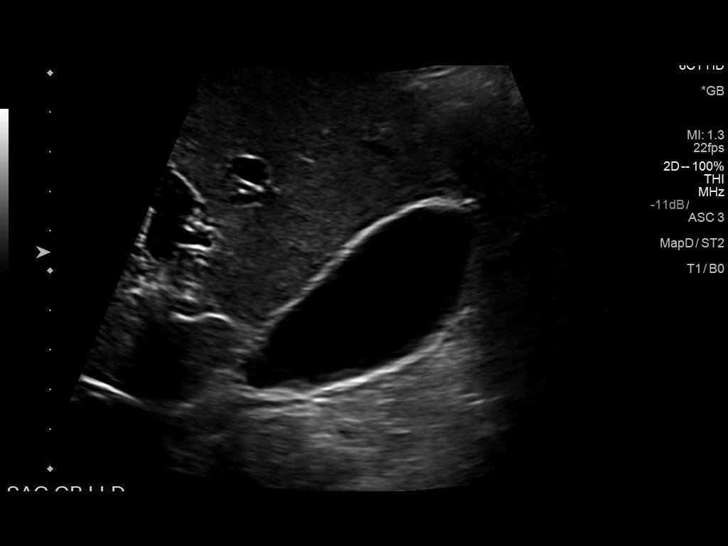
[im 12/46]
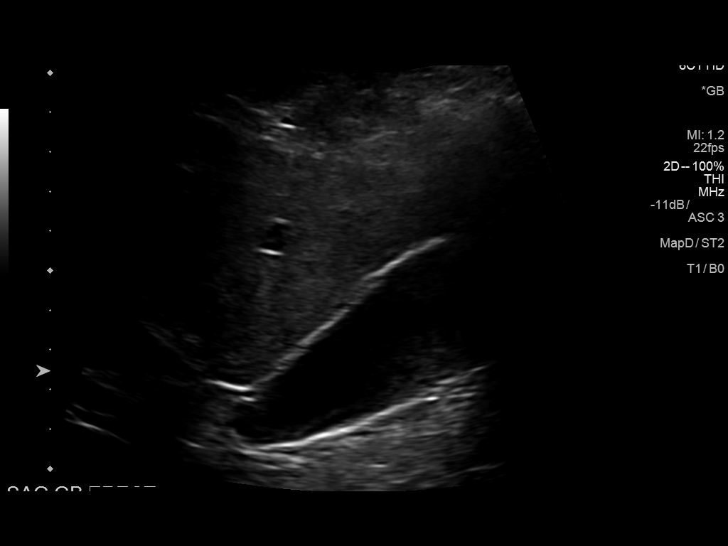
[im 16/46]
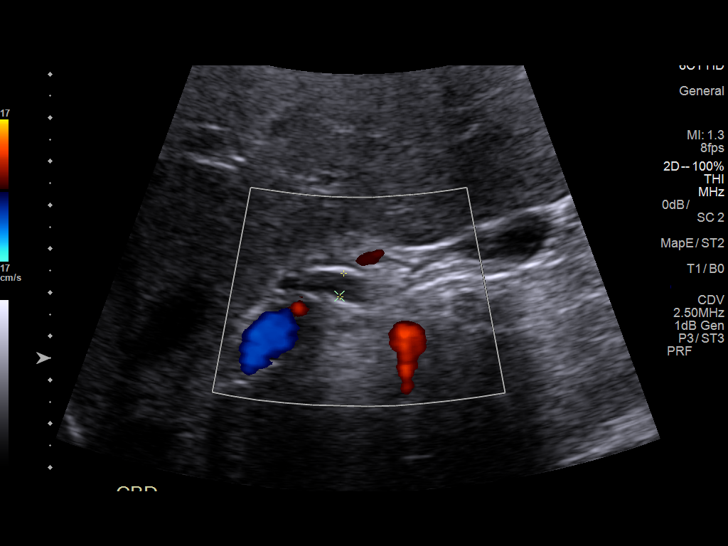
[im 17/46]
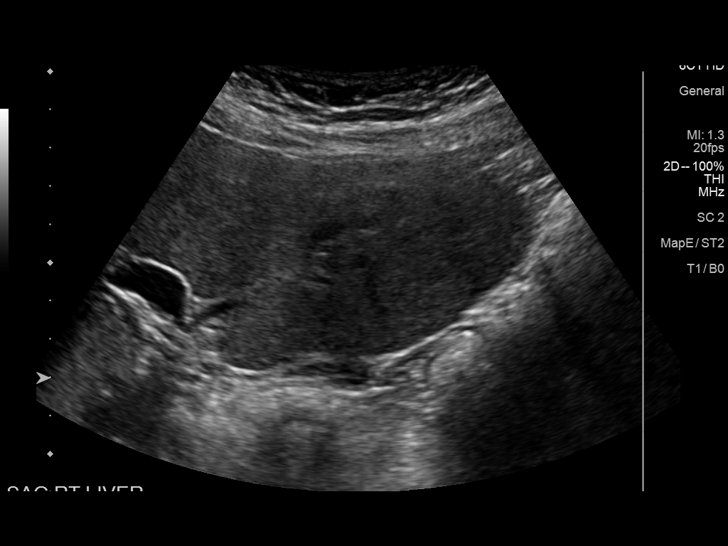
[im 21/46]
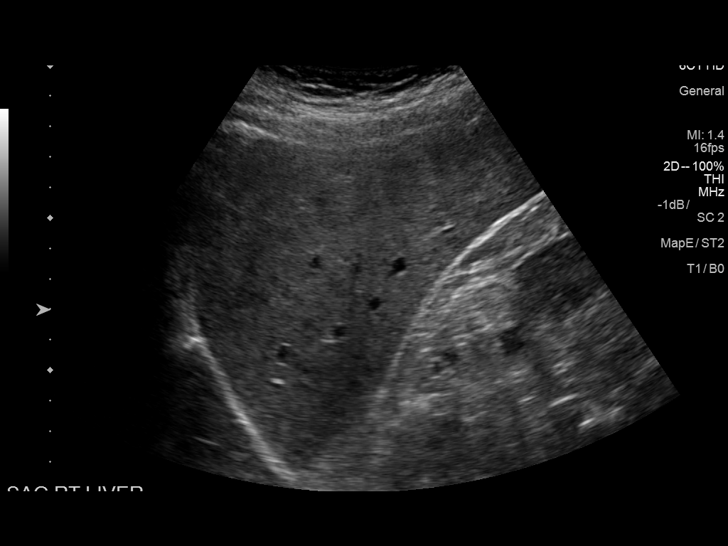
[im 25/46]
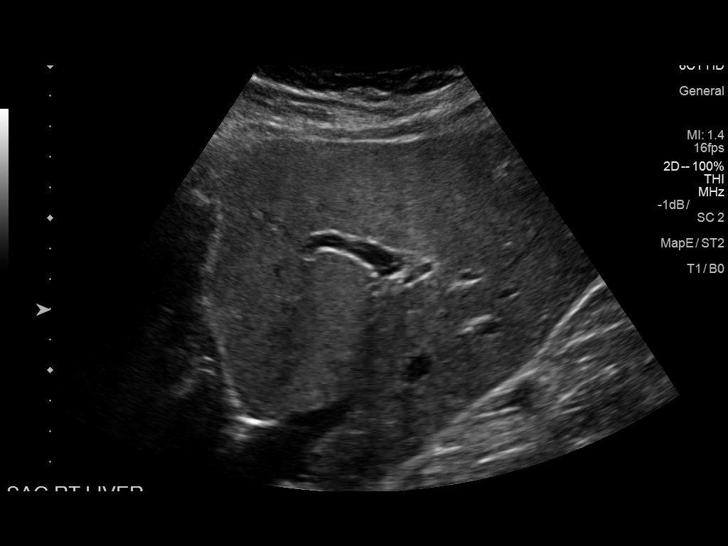
[im 29/46]
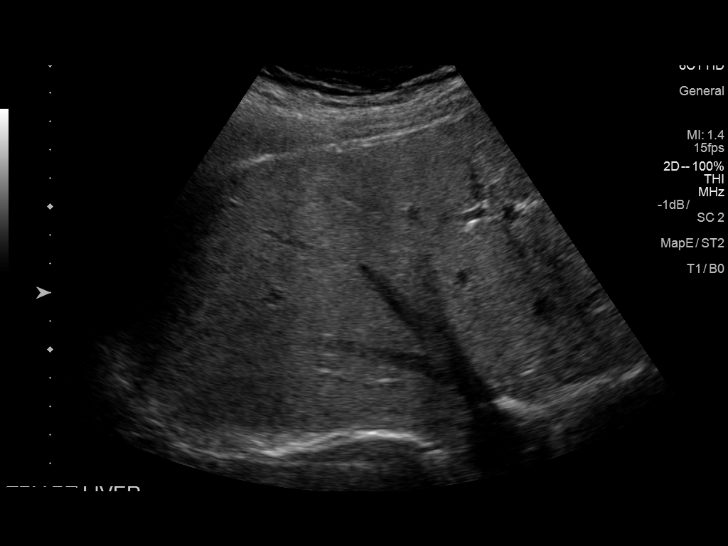
[im 31/46]
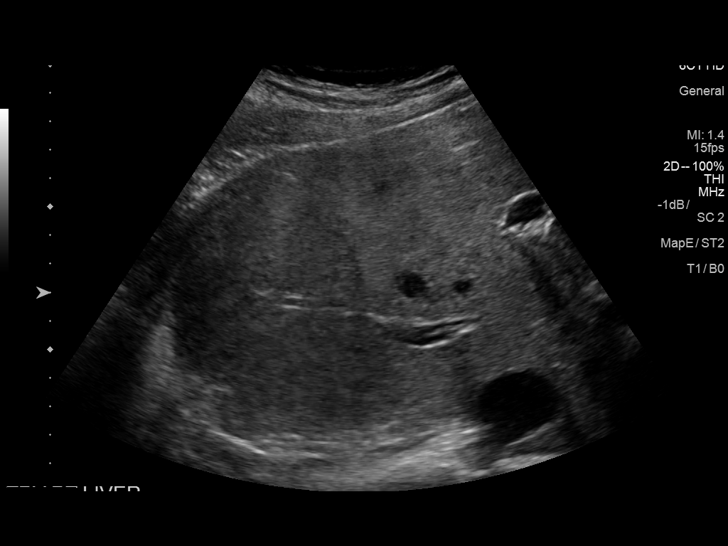
[im 34/46]
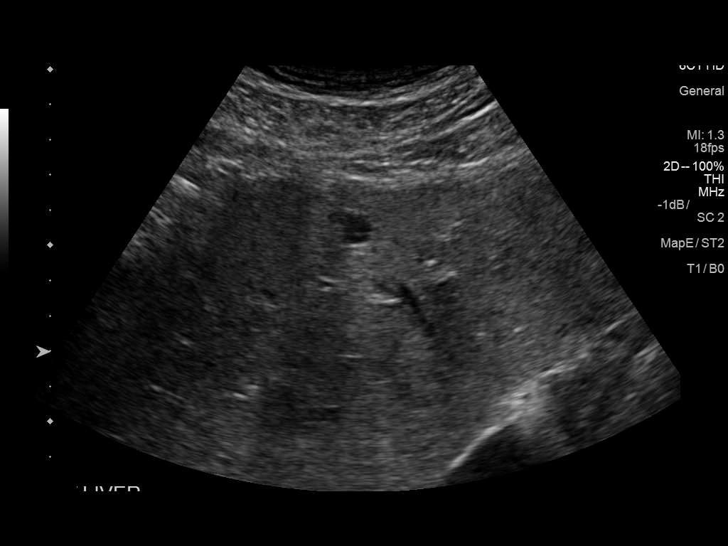
[im 38/46]
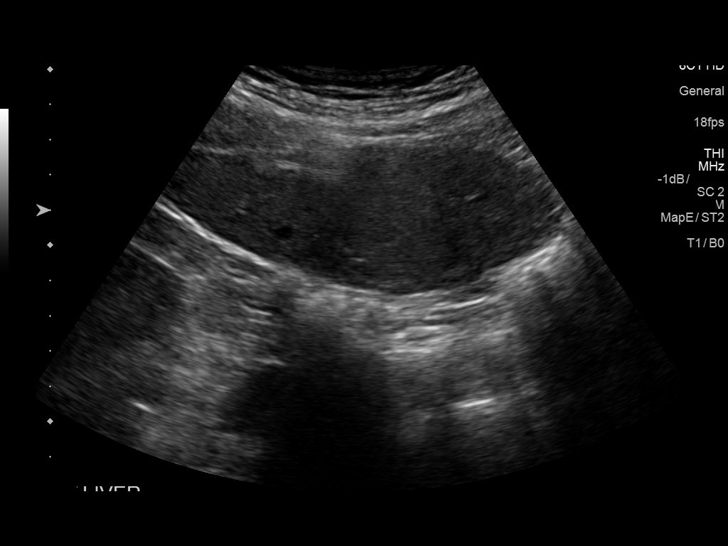
[im 42/46]
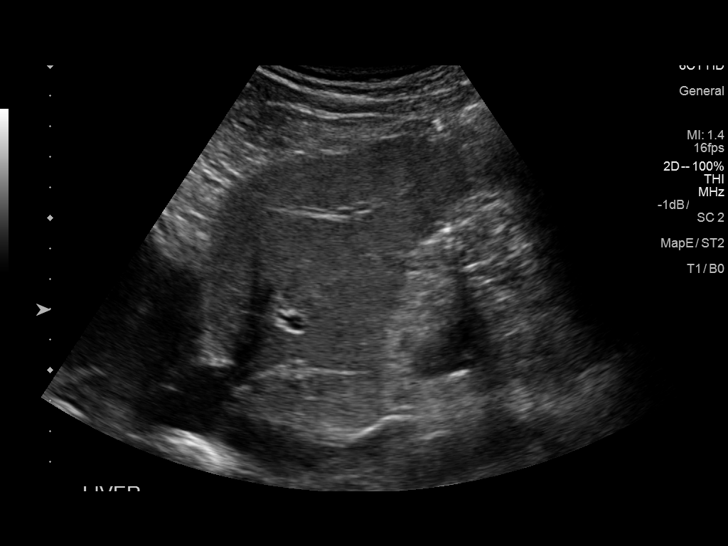
[im 46/46]
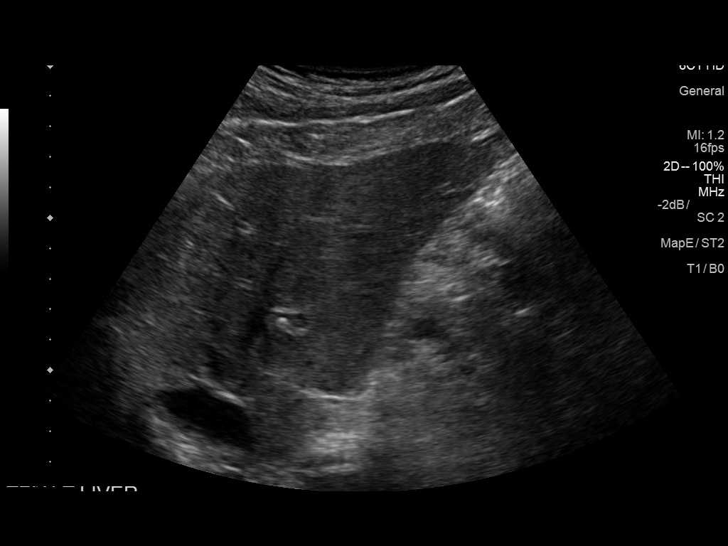

[14 of 25 positions shown; findings below may reference images not displayed]

FINDINGS: Gallbladder:

No gallstones or wall thickening visualized. No sonographic Murphy
sign noted by sonographer.

Common bile duct:

Diameter: 5.4 mm

Liver:

Slightly heterogeneous without dominant worrisome mass identified.
Slightly complex/septated cyst versus 2 cysts side-by-side measuring
up to 1.5 cm without significant change. Hepatopetal flow within the
portal vein.
IMPRESSION: Slightly heterogeneous echotexture of the liver without dominant
worrisome mass identified.

Slightly complex/septated liver cyst versus 2 cysts side-by-side
measuring up to 1.5 cm without significant change.

## 2018-12-18 ENCOUNTER — Other Ambulatory Visit: Payer: Self-pay | Admitting: Nurse Practitioner

## 2018-12-18 DIAGNOSIS — K7469 Other cirrhosis of liver: Secondary | ICD-10-CM
